# Patient Record
Sex: Female | Born: 2000 | Race: Black or African American | Hispanic: No | Marital: Single | Smoking: Never smoker
Health system: Southern US, Community
[De-identification: ages and names within clinical notes are randomized; demographics above are authoritative.]

## PROBLEM LIST (undated history)

## (undated) DIAGNOSIS — F329 Major depressive disorder, single episode, unspecified: Secondary | ICD-10-CM

## (undated) DIAGNOSIS — T7840XA Allergy, unspecified, initial encounter: Secondary | ICD-10-CM

## (undated) DIAGNOSIS — F909 Attention-deficit hyperactivity disorder, unspecified type: Secondary | ICD-10-CM

## (undated) DIAGNOSIS — D509 Iron deficiency anemia, unspecified: Secondary | ICD-10-CM

## (undated) DIAGNOSIS — F32A Depression, unspecified: Secondary | ICD-10-CM

## (undated) DIAGNOSIS — J45909 Unspecified asthma, uncomplicated: Secondary | ICD-10-CM

## (undated) HISTORY — DX: Depression, unspecified: F32.A

## (undated) HISTORY — DX: Allergy, unspecified, initial encounter: T78.40XA

## (undated) HISTORY — DX: Attention-deficit hyperactivity disorder, unspecified type: F90.9

## (undated) HISTORY — DX: Iron deficiency anemia, unspecified: D50.9

## (undated) HISTORY — DX: Unspecified asthma, uncomplicated: J45.909

## (undated) HISTORY — DX: Major depressive disorder, single episode, unspecified: F32.9

## (undated) HISTORY — PX: NO PAST SURGERIES: SHX2092

---
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
## 2021-01-15 ENCOUNTER — Other Ambulatory Visit: Payer: Self-pay | Admitting: Obstetrics and Gynecology

## 2021-01-21 ENCOUNTER — Encounter: Payer: Self-pay | Admitting: Obstetrics and Gynecology

## 2021-01-23 ENCOUNTER — Encounter: Payer: Self-pay | Admitting: Family Medicine

## 2021-01-30 ENCOUNTER — Encounter: Payer: Self-pay | Admitting: Obstetrics and Gynecology

## 2021-01-30 ENCOUNTER — Ambulatory Visit: Payer: 59 | Admitting: Obstetrics and Gynecology

## 2021-01-30 ENCOUNTER — Other Ambulatory Visit: Payer: Self-pay

## 2021-01-30 VITALS — BP 90/60 | Ht 61.0 in | Wt 130.0 lb

## 2021-01-30 DIAGNOSIS — Z3041 Encounter for surveillance of contraceptive pills: Secondary | ICD-10-CM

## 2021-01-30 DIAGNOSIS — R350 Frequency of micturition: Secondary | ICD-10-CM | POA: Diagnosis not present

## 2021-01-30 DIAGNOSIS — Z3046 Encounter for surveillance of implantable subdermal contraceptive: Secondary | ICD-10-CM

## 2021-01-30 LAB — POCT URINALYSIS DIPSTICK
Bilirubin, UA: NEGATIVE
Blood, UA: NEGATIVE
Glucose, UA: NEGATIVE
Ketones, UA: NEGATIVE
Leukocytes, UA: NEGATIVE
Nitrite, UA: NEGATIVE
Protein, UA: NEGATIVE
Spec Grav, UA: 1.025 (ref 1.010–1.025)
pH, UA: 6 (ref 5.0–8.0)

## 2021-01-30 MED ORDER — LEVONORGESTREL-ETHINYL ESTRAD 0.1-20 MG-MCG PO TABS: 1.0000 | ORAL_TABLET | Freq: Every day | ORAL | 3 refills | Status: DC

## 2021-01-30 NOTE — Progress Notes (Signed)
Kaitlyn Carrow, DO   Chief Complaint  Patient presents with   Contraception    Switch BC due to long cycles, mood swings. Not sure new BC method   STD testing   Urinary Tract Infection    Frequency urinating, no burning x 2-3 days    HPI:      Ms. Kaitlyn Webb is a 21 y.o. G0P0000 whose LMP was No LMP recorded. Patient has had an implant., presents today for nexplanon removal due to irreg bleeding, uncontrolled with POPs and now Lomedia 24. Having mood changes with lomedia. Would like to try OCPs only for cycle control. Nexplanon replaced 05/19/20. No hx of HTN, DVTs, migraines with aura.  She has never been sex active. Kaitlyn Webb you can get STDs from drinking after someone and her grandfather has an STD, pt doesn't know which one.  Pt with urinary frequency, with good flow, voiding every couple of hours. No caffeine use, no dysuria, urgency, hematuria, dysuria, LBP, fevers. Drinks at least 8 cups water daily. Same sx in past with neg UA, neg C&S.   Patient Active Problem List   Diagnosis Date Noted   Depression, major, single episode, moderate (HCC) 01/31/2016   Vitamin D deficiency 11/01/2015   Iron deficiency 10/11/2014   Asthma    ADHD (attention deficit hyperactivity disorder)    Allergy     Past Surgical History:  Procedure Laterality Date   NO PAST SURGERIES      Family History  Problem Relation Age of Onset   Hypertension Mother    Hypertension Father    Hypertension Maternal Grandmother    Arthritis Maternal Grandmother        Rheumatoid   Diabetes Maternal Grandmother    Lupus Maternal Grandmother    Cancer Maternal Grandmother        not sure cancer type   Diabetes Maternal Grandfather    Hypertension Maternal Grandfather     Social History   Socioeconomic History   Marital status: Single    Spouse name: Not on file   Number of children: Not on file   Years of education: Not on file   Highest education level: Not on file  Occupational History    Not on file  Tobacco Use   Smoking status: Never   Smokeless tobacco: Never  Vaping Use   Vaping Use: Never used  Substance and Sexual Activity   Alcohol use: No   Drug use: No   Sexual activity: Never    Birth control/protection: Implant, Pill  Other Topics Concern   Not on file  Social History Narrative   Not on file   Social Determinants of Health   Financial Resource Strain: Not on file  Food Insecurity: Not on file  Transportation Needs: Not on file  Physical Activity: Not on file  Stress: Not on file  Social Connections: Not on file  Intimate Partner Violence: Not on file    Outpatient Medications Prior to Visit  Medication Sig Dispense Refill   albuterol (VENTOLIN HFA) 108 (90 Base) MCG/ACT inhaler Inhale 1-2 puffs into the lungs every 6 (six) hours as needed for wheezing or shortness of breath. 1 Inhaler 6   EPINEPHRINE 0.3 mg/0.3 mL IJ SOAJ injection INJECT 0.3 MLS (0.3 MG TOTAL) INTO THE SKIN ONCE FOR 1 DOSE. 1 Device 12   ferrous sulfate (FERROUSUL) 325 (65 FE) MG tablet Take 1 tablet (325 mg total) by mouth 2 (two) times daily with a meal. 180 tablet 3  fluticasone (FLONASE) 50 MCG/ACT nasal spray Place 1 spray into both nostrils 2 (two) times daily.     Multiple Vitamin (MULTIVITAMIN) tablet Take 1 tablet by mouth daily.     naproxen (NAPROSYN) 500 MG tablet TAKE 1 TABLET (500 MG TOTAL) BY MOUTH 2 (TWO) TIMES DAILY WITH A MEAL. (Patient taking differently: Take 500 mg by mouth as needed.) 60 tablet 3   Vitamin D, Ergocalciferol, (DRISDOL) 1.25 MG (50000 UNIT) CAPS capsule Take 1 capsule (50,000 Units total) by mouth every 7 (seven) days. 12 capsule 0   Norethindrone Acetate-Ethinyl Estrad-FE (LOESTRIN 24 FE) 1-20 MG-MCG(24) tablet Take 1 tablet by mouth daily. CONTINUOUS DOSING 84 tablet 0   azelastine (ASTELIN) 0.1 % nasal spray PLACE 2 SPRAYS INTO BOTH NOSTRILS 2 (TWO) TIMES DAILY 30 mL 1   etonogestrel (NEXPLANON) 68 MG IMPL implant 1 each (68 mg total) by  Subdermal route once for 1 dose. 1 each 0   No facility-administered medications prior to visit.      ROS:  Review of Systems  Constitutional:  Negative for fever.  Gastrointestinal:  Negative for blood in stool, constipation, diarrhea, nausea and vomiting.  Genitourinary:  Positive for frequency and vaginal bleeding. Negative for dyspareunia, dysuria, flank pain, hematuria, urgency, vaginal discharge and vaginal pain.  Musculoskeletal:  Negative for back pain.  Skin:  Negative for rash.  BREAST: No symptoms   OBJECTIVE:   Vitals:  BP 90/60    Ht 5\' 1"  (1.549 m)    Wt 130 lb (59 kg)    BMI 24.56 kg/m   Physical Exam Constitutional:      Appearance: Normal appearance.  Pulmonary:     Effort: Pulmonary effort is normal.  Musculoskeletal:        General: Normal range of motion.  Neurological:     Mental Status: She is alert and oriented to person, place, and time.  Psychiatric:        Judgment: Judgment normal.    Nexplanon removal Procedure note - The Nexplanon was noted in the patient's arm and the end was identified. The skin was cleansed with a Betadine solution. A small injection of subcutaneous lidocaine with epinephrine was given over the end of the implant. An incision was made at the end of the implant. The rod was noted in the incision and grasped with a hemostat. It was noted to be intact.  Steri-Strip was placed approximating the incision. Hemostasis was noted.   Results: Results for orders placed or performed in visit on 01/30/21 (from the past 24 hour(s))  POCT Urinalysis Dipstick     Status: Normal   Collection Time: 01/30/21  3:55 PM  Result Value Ref Range   Color, UA yellow    Clarity, UA clear    Glucose, UA Negative Negative   Bilirubin, UA neg    Ketones, UA neg    Spec Grav, UA 1.025 1.010 - 1.025   Blood, UA neg    pH, UA 6.0 5.0 - 8.0   Protein, UA Negative Negative   Urobilinogen, UA     Nitrite, UA neg    Leukocytes, UA Negative Negative    Appearance     Odor       Assessment/Plan: Urinary frequency - Plan: POCT Urinalysis Dipstick; neg UA. Sx are normal voiding, reassurance. F/u prn.   Encounter for surveillance of contraceptive pills - Plan: levonorgestrel-ethinyl estradiol (AVIANE) 0.1-20 MG-MCG tablet; try different OCPs. Had mood changes with lomedia. Rx eRxd. Not sex active. F/u prn.  Nexplanon removal--Due to irreg bleeding, uncontrolled with addition of hormones. She was told to remove the dressing in 12-24 hours, to keep the incision area dry for 24 hours and to remove the Steristrip in 2-3  days.  Notify us if any signs of tenderness, redness, pain, or fevers develop.   Meds ordered this encounter  Medications   levonorgestrel-ethinyl estradiol (AVIANE) 0.1-20 MG-MCG tablet    Sig: Take 1 tablet by mouth daily.    Dispense:  84 tablet    Refill:  3    Order Specific Question:   Supervising Provider    Answer:   Gae Dry [id number]      Return if symptoms worsen or fail to improve.  Mathayus Stanbery B. Linzie Boursiquot, PA-C 01/30/2021 3:58 PM

## 2021-09-10 DIAGNOSIS — M2550 Pain in unspecified joint: Secondary | ICD-10-CM | POA: Diagnosis not present

## 2021-10-03 DIAGNOSIS — M2550 Pain in unspecified joint: Secondary | ICD-10-CM | POA: Diagnosis not present

## 2021-10-03 DIAGNOSIS — R69 Illness, unspecified: Secondary | ICD-10-CM | POA: Diagnosis not present

## 2021-10-03 DIAGNOSIS — J302 Other seasonal allergic rhinitis: Secondary | ICD-10-CM | POA: Diagnosis not present

## 2021-10-03 DIAGNOSIS — Z1331 Encounter for screening for depression: Secondary | ICD-10-CM | POA: Diagnosis not present

## 2021-10-03 DIAGNOSIS — Z1330 Encounter for screening examination for mental health and behavioral disorders, unspecified: Secondary | ICD-10-CM | POA: Diagnosis not present

## 2021-10-03 DIAGNOSIS — J4520 Mild intermittent asthma, uncomplicated: Secondary | ICD-10-CM | POA: Diagnosis not present

## 2021-10-03 DIAGNOSIS — R5383 Other fatigue: Secondary | ICD-10-CM | POA: Diagnosis not present

## 2021-10-03 DIAGNOSIS — T7805XA Anaphylactic reaction due to tree nuts and seeds, initial encounter: Secondary | ICD-10-CM | POA: Diagnosis not present

## 2021-10-31 ENCOUNTER — Encounter: Payer: Self-pay | Admitting: Obstetrics and Gynecology

## 2021-10-31 DIAGNOSIS — M2550 Pain in unspecified joint: Secondary | ICD-10-CM | POA: Diagnosis not present

## 2021-10-31 DIAGNOSIS — M791 Myalgia, unspecified site: Secondary | ICD-10-CM | POA: Diagnosis not present

## 2021-11-01 DIAGNOSIS — R69 Illness, unspecified: Secondary | ICD-10-CM | POA: Diagnosis not present

## 2021-11-07 ENCOUNTER — Encounter: Payer: Self-pay | Admitting: Obstetrics and Gynecology

## 2021-11-08 MED ORDER — MEDROXYPROGESTERONE ACETATE 150 MG/ML IM SUSP: 150.0000 mg | Freq: Once | INTRAMUSCULAR | Status: DC

## 2021-11-13 DIAGNOSIS — Z23 Encounter for immunization: Secondary | ICD-10-CM | POA: Diagnosis not present

## 2021-11-13 DIAGNOSIS — E611 Iron deficiency: Secondary | ICD-10-CM | POA: Diagnosis not present

## 2021-11-13 DIAGNOSIS — E559 Vitamin D deficiency, unspecified: Secondary | ICD-10-CM | POA: Diagnosis not present

## 2021-11-13 DIAGNOSIS — J4520 Mild intermittent asthma, uncomplicated: Secondary | ICD-10-CM | POA: Diagnosis not present

## 2021-11-13 DIAGNOSIS — R5383 Other fatigue: Secondary | ICD-10-CM | POA: Diagnosis not present

## 2021-11-21 ENCOUNTER — Other Ambulatory Visit (HOSPITAL_COMMUNITY)
Admission: RE | Admit: 2021-11-21 | Discharge: 2021-11-21 | Disposition: A | Discharge status: Home / Self Care | Payer: 59 | Source: Ambulatory Visit | Attending: Obstetrics | Admitting: Obstetrics

## 2021-11-21 ENCOUNTER — Encounter: Payer: Self-pay | Admitting: Obstetrics

## 2021-11-21 ENCOUNTER — Ambulatory Visit: Payer: 59 | Admitting: Obstetrics

## 2021-11-21 VITALS — BP 120/74 | Ht 61.0 in | Wt 137.0 lb

## 2021-11-21 DIAGNOSIS — N898 Other specified noninflammatory disorders of vagina: Secondary | ICD-10-CM | POA: Diagnosis not present

## 2021-11-21 DIAGNOSIS — R102 Pelvic and perineal pain: Secondary | ICD-10-CM | POA: Diagnosis not present

## 2021-11-21 NOTE — Progress Notes (Signed)
Obstetrics & Gynecology Office Visit   Chief Complaint:  Chief Complaint  Patient presents with   Pelvic Pain    Pt started having some cramping/pain after her first time having IC.     History of Present Illness: Kaitlyn Webb presents to discuss an episode of acute vaginal and rectal pain during and after intercourse. She carefully describes starting to have pain with having IC with her BF. She shares that she never has trouble with sexual toys and penetration, but recently her BF initiated sexual intercourse (consensual) and she felt acture pain both vaginally and then later rectally. She did not have an orgasm. She denies any vaginal bleeding, lacerations or other irritation from this experience. She did take some Ibuprofen afterwards and this helped. She mad an appointment to discuss what happened.   Review of Systems:  Review of Systems  Constitutional: Negative.   HENT: Negative.    Eyes: Negative.   Respiratory: Negative.    Gastrointestinal: Negative.   Genitourinary: Negative.   Musculoskeletal: Negative.   Skin: Negative.   All other systems reviewed and are negative.    Past Medical History:  Past Medical History:  Diagnosis Date   ADHD (attention deficit hyperactivity disorder)    Allergy    Asthma    Depression    Iron deficiency anemia     Past Surgical History:  Past Surgical History:  Procedure Laterality Date   NO PAST SURGERIES      Gynecologic History: Patient's last menstrual period was 10/23/2021 (exact date).  Obstetric History: G0P0000  Family History:  Family History  Problem Relation Age of Onset   Hypertension Mother    Hypertension Father    Hypertension Maternal Grandmother    Arthritis Maternal Grandmother        Rheumatoid   Diabetes Maternal Grandmother    Lupus Maternal Grandmother    Cancer Maternal Grandmother        not sure cancer type   Diabetes Maternal Grandfather    Hypertension Maternal Grandfather     Social  History:  Social History   Socioeconomic History   Marital status: Single    Spouse name: Not on file   Number of children: Not on file   Years of education: Not on file   Highest education level: Not on file  Occupational History   Not on file  Tobacco Use   Smoking status: Never   Smokeless tobacco: Never  Vaping Use   Vaping Use: Never used  Substance and Sexual Activity   Alcohol use: No   Drug use: No   Sexual activity: Yes    Birth control/protection: Pill  Other Topics Concern   Not on file  Social History Narrative   Not on file   Social Determinants of Health   Financial Resource Strain: Not on file  Food Insecurity: Not on file  Transportation Needs: Not on file  Physical Activity: Insufficiently Active (01/31/2017)   Exercise Vital Sign    Days of Exercise per Week: 2 days    Minutes of Exercise per Session: 60 min  Stress: No Stress Concern Present (01/31/2017)   [employer] of Occupational Health - Occupational Stress Questionnaire    Feeling of Stress : Not at all  Social Connections: Not on file  Intimate Partner Violence: Not on file    Allergies:  Allergies  Allergen Reactions   Other Other (See Comments)    NUTS CATS DOGS POLLEN GRASSES    Medications: Prior to Admission  medications   Medication Sig Start Date End Date Taking? Authorizing Provider  albuterol (VENTOLIN HFA) 108 (90 Base) MCG/ACT inhaler Inhale 1-2 puffs into the lungs every 6 (six) hours as needed for wheezing or shortness of breath. 06/03/18  Yes Johnson, Megan P, DO  EPINEPHRINE 0.3 mg/0.3 mL IJ SOAJ injection INJECT 0.3 MLS (0.3 MG TOTAL) INTO THE SKIN ONCE FOR 1 DOSE. 04/11/18  Yes Johnson, Megan P, DO  ferrous sulfate (FERROUSUL) 325 (65 FE) MG tablet Take 1 tablet (325 mg total) by mouth 2 (two) times daily with a meal. 05/09/18  Yes Johnson, Megan P, DO  fluticasone (FLONASE) 50 MCG/ACT nasal spray Place 1 spray into both nostrils 2 (two) times daily. 12/20/20  Yes  [provider]  levonorgestrel-ethinyl estradiol (AVIANE) 0.1-20 MG-MCG tablet Take 1 tablet by mouth daily. 01/30/21  Yes Copland, Ilona Sorrel, PA-C  Multiple Vitamin (MULTIVITAMIN) tablet Take 1 tablet by mouth daily.   Yes [provider]  naproxen (NAPROSYN) 500 MG tablet TAKE 1 TABLET (500 MG TOTAL) BY MOUTH 2 (TWO) TIMES DAILY WITH A MEAL. Patient taking differently: Take 500 mg by mouth as needed. 09/03/18  Yes Johnson, Megan P, DO  Vitamin D, Ergocalciferol, (DRISDOL) 1.25 MG (50000 UNIT) CAPS capsule Take 1 capsule (50,000 Units total) by mouth every 7 (seven) days. 10/07/20  Yes [name], DO    Physical Exam Vitals:  Vitals:   11/21/21 1405  BP: 120/74   Patient's last menstrual period was 10/23/2021 (exact date).  Physical Exam Constitutional:      Appearance: Normal appearance. She is normal weight.  Cardiovascular:     Rate and Rhythm: Normal rate and regular rhythm.  Pulmonary:     Effort: Pulmonary effort is normal.     Breath sounds: Normal breath sounds.  Abdominal:     General: Abdomen is flat.     Palpations: Abdomen is soft.  Genitourinary:    General: Normal vulva.     Rectum: Normal.     Comments: Normal female genitalia. No lacerations, lesions or areas of irritation noted. Normal vaginal rugae. Some white vaginal discharge noted- thin, nonmalodorous Aptima swab retrieved for testing. Musculoskeletal:     Cervical back: Normal range of motion and neck supple.  Skin:    General: Skin is warm and dry.  Neurological:     General: No focal deficit present.     Mental Status: She is alert and oriented to person, place, and time.  Psychiatric:        Mood and Affect: Mood normal.        Behavior: Behavior normal.      Assessment: 21 y.o. G0P0000 with episode fo peovic and rectal pain during intercourse. Vaginal discharge  Plan: Problem List Items Addressed This Visit   None Visit Diagnoses     Vaginal discharge    -  Primary    Relevant Orders   Cervicovaginal ancillary only   Complaint of pelvic pain         We discussed the possibility of inadequate lubrication , which she feels is not the issue. Aptima swab sent to check for any infection. I have advised her to reach out if this occurs again. She is to use Ibuprofen and also try a tub soak in warm to hot water. Much reassurance provded today. She is alerted that at age 21 she needs to have a pap smear. Will follow her based on the aptima results.Mirna Mires, CNM  11/21/2021 7:37 PM

## 2021-11-23 LAB — CERVICOVAGINAL ANCILLARY ONLY
Bacterial Vaginitis (gardnerella): POSITIVE — AB
Candida Glabrata: NEGATIVE
Candida Vaginitis: NEGATIVE
Chlamydia: NEGATIVE
Comment: NEGATIVE
Comment: NEGATIVE
Comment: NEGATIVE
Comment: NEGATIVE
Comment: NEGATIVE
Comment: NORMAL
Neisseria Gonorrhea: NEGATIVE
Trichomonas: NEGATIVE

## 2021-11-24 ENCOUNTER — Encounter: Payer: Self-pay | Admitting: Obstetrics

## 2021-11-24 ENCOUNTER — Other Ambulatory Visit: Payer: Self-pay | Admitting: Obstetrics

## 2021-11-24 DIAGNOSIS — B9689 Other specified bacterial agents as the cause of diseases classified elsewhere: Secondary | ICD-10-CM

## 2021-11-24 MED ORDER — METRONIDAZOLE 500 MG PO TABS: 500.0000 mg | ORAL_TABLET | Freq: Two times a day (BID) | ORAL | 0 refills | Status: DC

## 2021-11-24 NOTE — Progress Notes (Signed)
Pateint recently seen in the office. Aptima swab shows BV. A Rx for Metronidazole is sent to her pharmacy.  Mirna Mires, CNM  11/24/2021 1:54 AM

## 2021-11-30 ENCOUNTER — Encounter: Payer: Self-pay | Admitting: Obstetrics

## 2021-11-30 DIAGNOSIS — B9689 Other specified bacterial agents as the cause of diseases classified elsewhere: Secondary | ICD-10-CM

## 2021-12-01 MED ORDER — METRONIDAZOLE 500 MG PO TABS: 500.0000 mg | ORAL_TABLET | Freq: Two times a day (BID) | ORAL | 0 refills | Status: AC

## 2021-12-05 ENCOUNTER — Ambulatory Visit (INDEPENDENT_AMBULATORY_CARE_PROVIDER_SITE_OTHER): Payer: 59

## 2021-12-05 VITALS — Wt 133.7 lb

## 2021-12-05 DIAGNOSIS — Z3042 Encounter for surveillance of injectable contraceptive: Secondary | ICD-10-CM | POA: Diagnosis not present

## 2021-12-05 DIAGNOSIS — Z3202 Encounter for pregnancy test, result negative: Secondary | ICD-10-CM

## 2021-12-05 LAB — POCT URINE PREGNANCY: Preg Test, Ur: NEGATIVE

## 2021-12-05 MED ORDER — MEDROXYPROGESTERONE ACETATE 150 MG/ML IM SUSP
150.0000 mg | Freq: Once | INTRAMUSCULAR | Status: AC
  Administered 2021-12-05: 150 mg via INTRAMUSCULAR

## 2021-12-05 NOTE — Patient Instructions (Signed)

## 2021-12-05 NOTE — Progress Notes (Addendum)
Date last pap: N/A. Last Depo-Provera: N/A, receiving first injection today. Side Effects if any: N/A. Serum HCG indicated? No. Depo-Provera 150 mg IM given by: Maye Hides, NCMA. Next appointment due 2/20/-03/07/2022.

## 2022-02-08 ENCOUNTER — Ambulatory Visit (INDEPENDENT_AMBULATORY_CARE_PROVIDER_SITE_OTHER): Payer: 59 | Admitting: Obstetrics

## 2022-02-08 ENCOUNTER — Encounter: Payer: Self-pay | Admitting: Obstetrics

## 2022-02-08 VITALS — BP 117/70 | HR 87 | Ht 61.0 in | Wt 128.0 lb

## 2022-02-08 DIAGNOSIS — Z3042 Encounter for surveillance of injectable contraceptive: Secondary | ICD-10-CM

## 2022-02-08 DIAGNOSIS — Z3202 Encounter for pregnancy test, result negative: Secondary | ICD-10-CM | POA: Diagnosis not present

## 2022-02-08 DIAGNOSIS — N939 Abnormal uterine and vaginal bleeding, unspecified: Secondary | ICD-10-CM

## 2022-02-08 LAB — POCT URINE PREGNANCY: Preg Test, Ur: NEGATIVE

## 2022-02-08 NOTE — Progress Notes (Signed)
Obstetrics & Gynecology Office Visit   Chief Complaint:  Chief Complaint  Patient presents with   Pelvic Pain    Center of pelvic    History of Present Illness: Yvonna pesents today with a complaint of irregular vaginal spotting that has bothered her for several weeks now. She was last seen for a contraceptive change, moving from OCPs to Depo Provera.  She  reports no new medical problems. She is not wearing a pad today.   Review of Systems:  Review of Systems  Constitutional: Negative.   HENT: Negative.    Eyes: Negative.   Respiratory: Negative.    Cardiovascular: Negative.   Gastrointestinal: Negative.   Genitourinary: Negative.   Skin: Negative.   All other systems reviewed and are negative.    Past Medical History:  Past Medical History:  Diagnosis Date   ADHD (attention deficit hyperactivity disorder)    Allergy    Asthma    Depression    Iron deficiency anemia     Past Surgical History:  Past Surgical History:  Procedure Laterality Date   NO PAST SURGERIES      Gynecologic History: No LMP recorded. Patient has had an injection.  Obstetric History: G0P0000  Family History:  Family History  Problem Relation Age of Onset   Hypertension Mother    Hypertension Father    Hypertension Maternal Grandmother    Arthritis Maternal Grandmother        Rheumatoid   Diabetes Maternal Grandmother    Lupus Maternal Grandmother    Cancer Maternal Grandmother        not sure cancer type   Diabetes Maternal Grandfather    Hypertension Maternal Grandfather     Social History:  Social History   Socioeconomic History   Marital status: Single    Spouse name: Not on file   Number of children: Not on file   Years of education: Not on file   Highest education level: Not on file  Occupational History   Not on file  Tobacco Use   Smoking status: Never   Smokeless tobacco: Never  Vaping Use   Vaping Use: Never used  Substance and Sexual Activity   Alcohol  use: No   Drug use: No   Sexual activity: Not Currently    Birth control/protection: Injection  Other Topics Concern   Not on file  Social History Narrative   Not on file   Social Determinants of Health   Financial Resource Strain: Not on file  Food Insecurity: Not on file  Transportation Needs: Not on file  Physical Activity: Insufficiently Active (01/31/2017)   Exercise Vital Sign    Days of Exercise per Week: 2 days    Minutes of Exercise per Session: 60 min  Stress: No Stress Concern Present (01/31/2017)   Gas    Feeling of Stress : Not at all  Social Connections: Not on file  Intimate Partner Violence: Not on file    Allergies:  Allergies  Allergen Reactions   Other Other (See Comments)    NUTS  CATS  DOGS  POLLEN  GRASSES  NUTS   CATS  DOGS  POLLEN  GRASSES    Medications: Prior to Admission medications   Medication Sig Start Date End Date Taking? Authorizing Provider  albuterol (VENTOLIN HFA) 108 (90 Base) MCG/ACT inhaler Inhale 1-2 puffs into the lungs every 6 (six) hours as needed for wheezing or shortness of breath. 06/03/18  Yes Johnson, Megan P, DO  EPINEPHRINE 0.3 mg/0.3 mL IJ SOAJ injection INJECT 0.3 MLS (0.3 MG TOTAL) INTO THE SKIN ONCE FOR 1 DOSE. 04/11/18  Yes Johnson, Megan P, DO  ferrous sulfate (FERROUSUL) 325 (65 FE) MG tablet Take 1 tablet (325 mg total) by mouth 2 (two) times daily with a meal. 05/09/18  Yes Johnson, Megan P, DO  fluticasone (FLONASE) 50 MCG/ACT nasal spray Place 1 spray into both nostrils 2 (two) times daily. 12/20/20  Yes [provider]  Multiple Vitamin (MULTIVITAMIN) tablet Take 1 tablet by mouth daily.   Yes [provider]  naproxen (NAPROSYN) 500 MG tablet TAKE 1 TABLET (500 MG TOTAL) BY MOUTH 2 (TWO) TIMES DAILY WITH A MEAL. Patient taking differently: Take 500 mg by mouth as needed. 09/03/18  Yes Johnson, Megan P, DO  QVAR  REDIHALER 40 MCG/ACT inhaler Inhale into the lungs.   Yes [provider]    Physical Exam Vitals:  Vitals:   02/08/22 0956  BP: 117/70  Pulse: 87   No LMP recorded. Patient has had an injection.  Physical Exam Vitals and nursing note reviewed.  Constitutional:      Appearance: Normal appearance. She is normal weight.  Cardiovascular:     Rate and Rhythm: Normal rate and regular rhythm.  Pulmonary:     Effort: Pulmonary effort is normal.     Breath sounds: Normal breath sounds.  Abdominal:     Palpations: Abdomen is soft.  Genitourinary:    General: Normal vulva.     Rectum: Normal.     Comments: Spec exam reveals no vaginal discharge or visible bleeding. No exteranl lesions or areas of irritation. Normal appearing vaginal mucosa. Musculoskeletal:        General: Normal range of motion.     Cervical back: Normal range of motion and neck supple.  Skin:    General: Skin is warm and dry.  Neurological:     Mental Status: She is alert.  Psychiatric:        Mood and Affect: Mood normal.        Behavior: Behavior normal.      Assessment: 22 y.o. G0P0000 No problem-specific Assessment & Plan notes found for this encounter.   Plan: Problem List Items Addressed This Visit   None Visit Diagnoses     Vaginal spotting    -  Primary   Relevant Orders   POCT urine pregnancy (Completed)     Time spent providing education on Depo and the usual side effects, including irregular vaginal spotting that can last from weeks to months. She has had only one injection. Encouraged her to continue with her next injection and she can follow up at the office for another consultation on contraception if she desires. She verbalizes understanding. Also suggested she try using several repeated doses of Ibuprofen when the spotting occurs.  Imagene Riches, CNM  02/09/2022 5:01 PM

## 2022-02-09 DIAGNOSIS — Z309 Encounter for contraceptive management, unspecified: Secondary | ICD-10-CM | POA: Insufficient documentation

## 2022-02-20 ENCOUNTER — Ambulatory Visit (INDEPENDENT_AMBULATORY_CARE_PROVIDER_SITE_OTHER): Payer: BC Managed Care – PPO

## 2022-02-20 VITALS — BP 100/70 | Ht 61.0 in | Wt 129.0 lb

## 2022-02-20 DIAGNOSIS — Z3042 Encounter for surveillance of injectable contraceptive: Secondary | ICD-10-CM

## 2022-02-20 MED ORDER — MEDROXYPROGESTERONE ACETATE 150 MG/ML IM SUSP
150.0000 mg | Freq: Once | INTRAMUSCULAR | Status: AC
  Administered 2022-02-20: 150 mg via INTRAMUSCULAR

## 2022-02-20 NOTE — Patient Instructions (Signed)

## 2022-02-20 NOTE — Progress Notes (Signed)
    NURSE VISIT NOTE  Subjective:    Patient ID: Kaitlyn Webb, female    DOB: 12-Apr-2000, 22 y.o.   MRN: [id number]  HPI  Patient is a 22 y.o. G8P0000 female who presents for depo provera injection.   Objective:    BP 100/70   Ht 5' 1"$  (1.549 m)   Wt 129 lb (58.5 kg)   BMI 24.37 kg/m   Last Annual: Patient has not had an annual visit, will call soon to schedule with Gigi Gin since schedule is not out for scheduling as of today. Last pap: N/A. Last Depo-Provera: 12/05/2021. Side Effects if any: none. Serum HCG indicated? No . Depo-Provera 150 mg IM given by: Drenda Freeze, CMA. Site: Right Deltoid  Lab Review    Assessment:   1. Encounter for surveillance of injectable contraceptive      Plan:   Next appointment due between May 8 and May 22.    Drenda Freeze, CMA

## 2022-05-15 ENCOUNTER — Ambulatory Visit: Payer: Self-pay

## 2022-05-18 ENCOUNTER — Ambulatory Visit (INDEPENDENT_AMBULATORY_CARE_PROVIDER_SITE_OTHER): Payer: Managed Care, Other (non HMO)

## 2022-05-18 VITALS — BP 124/76 | Ht 61.0 in | Wt 134.0 lb

## 2022-05-18 DIAGNOSIS — Z3042 Encounter for surveillance of injectable contraceptive: Secondary | ICD-10-CM

## 2022-05-18 MED ORDER — MEDROXYPROGESTERONE ACETATE 150 MG/ML IM SUSP
150.0000 mg | Freq: Once | INTRAMUSCULAR | Status: AC
  Administered 2022-05-18: 150 mg via INTRAMUSCULAR

## 2022-05-18 NOTE — Progress Notes (Signed)
    NURSE VISIT NOTE  Subjective:    Patient ID: Kaitlyn Webb, female    DOB: 08/09/2000, 22 y.o.   MRN: [id number]  HPI  Patient is a 22 y.o. G0P0000 female who presents for depo provera injection.   Objective:    BP 124/76   Ht 5\' 1"  (1.549 m)   Wt 134 lb (60.8 kg)   BMI 25.32 kg/m   Last Annual: n/a sent to schedule . Last pap: N/A. Last Depo-Provera: 02/20/22. Side Effects if any: spotting. Serum HCG indicated? No . Depo-Provera 150 mg IM given by: Cornelius Moras, CMA. Site: Left Deltoid    Assessment:   1. Encounter for surveillance of injectable contraceptive      Plan:   Next appointment due between 08/03/22 and 08/17/22.    Cornelius Moras, CMA

## 2022-05-18 NOTE — Patient Instructions (Signed)
Contraceptive Injection A contraceptive injection is a shot that prevents pregnancy. It is also called a birth control shot. The shot contains the hormone progestin, which prevents pregnancy by: Stopping the ovaries from releasing eggs. Thickening cervical mucus to prevent sperm from entering the cervix. Thinning the lining of the uterus to prevent a fertilized egg from attaching to the uterus. Contraceptive injections are given under the skin (subcutaneous) or into a muscle (intramuscular). For these shots to work, you must get one of them every 3 months (12-13 weeks) from a health care provider. Tell a health care provider about: Any allergies you have. All medicines you are taking, including vitamins, herbs, eye drops, creams, and over-the-counter medicines. Any blood disorders you have. Any medical conditions you have. Whether you are pregnant or may be pregnant. What are the risks? Generally, this is a safe procedure. However, problems may occur, including: Mood changes or depression. Loss of bone density (osteoporosis) after long-term use. Blood clots. This is rare. Higher risk of an egg being fertilized outside your uterus (ectopic pregnancy).This is rare. What happens before the procedure? Your health care provider may do a routine physical exam. You may have a test to make sure you are not pregnant. What happens during the procedure?  The area where the shot will be given will be cleaned and sanitized with alcohol. A needle will be inserted into a muscle in your upper arm or buttock, or into the skin of your thigh or abdomen. The needle will be attached to a syringe with the medicine inside of it. The medicine will be pushed through the syringe and injected into your body. A small bandage (dressing) may be placed over the injection site. What can I expect after the procedure? After the procedure, it is common to have: Soreness around the injection site for a couple of  days. Irregular menstrual bleeding. Weight gain. Breast tenderness. Headaches. Discomfort in your abdomen. Ask your health care provider whether you need to use an added method of birth control (backup contraception), such as a condom, sponge, or spermicide. If the first shot is given 1-7 days after the start of your last menstrual period, you will not need backup contraception. If the first shot is given at any other time during your menstrual cycle, you should avoid having sex. If you do have sex, you will need to use backup contraception for 7 days after you receive the shot. Follow these instructions at home: General instructions Take over-the-counter and prescription medicines only as told by your health care provider. Do not rub or massage the injection site. Track your menstrual periods so you will know if they become irregular. Always use a condom to protect against sexually transmitted infections (STIs). Make sure you schedule an appointment in time for your next shot and mark it on your calendar. You must get an injection every 3 months (12-13 weeks) to prevent pregnancy. Lifestyle Do not use any products that contain nicotine or tobacco. These products include cigarettes, chewing tobacco, and vaping devices, such as e-cigarettes. If you need help quitting, ask your health care provider. Eat foods that are high in calcium and vitamin D, such as milk, cheese, and salmon. Doing this may help with any loss in bone density caused by the contraceptive injection. Ask your health care provider for dietary recommendations. Contact a health care provider if you: Have nausea or vomiting. Have abnormal vaginal discharge or bleeding. Miss a menstrual period or think you might be pregnant. Experience mood changes   or depression. Feel dizzy or light-headed. Have leg pain. Get help right away if you: Have chest pain or cough up blood. Have shortness of breath. Have a severe headache that does  not go away. Have numbness in any part of your body. Have slurred speech or vision problems. Have vaginal bleeding that is abnormally heavy or does not stop, or you have severe pain in your abdomen. Have depression that does not get better with treatment. If you ever feel like you may hurt yourself or others, or have thoughts about taking your own life, get help right away. Go to your nearest emergency department or: Call your local emergency services (911 in the [place] Call a suicide crisis helpline, such as the National Suicide Prevention Lifeline at 1-800-273-8255 or 988 in the [place] This is open 24 hours a day in the [place] Text the Crisis Text Line at 741741 (in the [place] Summary A contraceptive injection is a shot that prevents pregnancy. It is also called the birth control shot. The shot is given under the skin (subcutaneous) or into a muscle (intramuscular). After this procedure, it is common to have soreness around the injection site for a couple of days. To prevent pregnancy, the shot must be given by a health care provider every 3 months (12-13 weeks). After you have the shot, ask your health care provider whether you need to use an added method of birth control (backup contraception), such as a condom, sponge, or spermicide. This information is not intended to replace advice given to you by your health care provider. Make sure you discuss any questions you have with your health care provider. Document Revised: 07/13/2020 Document Reviewed: 06/29/2019 Elsevier Patient Education  2023 Elsevier Inc.  

## 2022-06-20 ENCOUNTER — Ambulatory Visit: Payer: Managed Care, Other (non HMO) | Admitting: Obstetrics

## 2022-07-09 ENCOUNTER — Encounter: Payer: Self-pay | Admitting: Obstetrics

## 2022-07-09 ENCOUNTER — Ambulatory Visit: Payer: Managed Care, Other (non HMO) | Admitting: Obstetrics

## 2022-07-09 ENCOUNTER — Other Ambulatory Visit (HOSPITAL_COMMUNITY)
Admission: RE | Admit: 2022-07-09 | Discharge: 2022-07-09 | Disposition: A | Discharge status: Home / Self Care | Payer: Managed Care, Other (non HMO) | Source: Ambulatory Visit | Attending: Obstetrics | Admitting: Obstetrics

## 2022-07-09 VITALS — BP 131/77 | HR 95 | Ht 61.0 in | Wt 137.4 lb

## 2022-07-09 DIAGNOSIS — N898 Other specified noninflammatory disorders of vagina: Secondary | ICD-10-CM | POA: Insufficient documentation

## 2022-07-09 DIAGNOSIS — Z3041 Encounter for surveillance of contraceptive pills: Secondary | ICD-10-CM

## 2022-07-09 DIAGNOSIS — Z124 Encounter for screening for malignant neoplasm of cervix: Secondary | ICD-10-CM

## 2022-07-09 DIAGNOSIS — Z01419 Encounter for gynecological examination (general) (routine) without abnormal findings: Secondary | ICD-10-CM

## 2022-07-09 MED ORDER — MEDROXYPROGESTERONE ACETATE 150 MG/ML IM SUSP: 150.0000 mg | INTRAMUSCULAR | Status: AC

## 2022-07-09 NOTE — Progress Notes (Signed)
Gynecology Annual Exam  PCP: [name], DO  Chief Complaint:  Chief Complaint  Patient presents with   Annual Exam    History of Present Illness:  Ms. Kaitlyn Webb is a 22 y.o. G0P0000 who LMP was No LMP recorded. Patient has had an injection., presents today for her annual examination.  Her menses are absent due to her use of Depo provera,  Dysmenorrhea none. She does not have intermenstrual bleeding.    Kaitlyn Webb lives with her mother. She helps taking care of family and friends dogs. 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 She started on Depo Provera months back and loves this method.  She is not sexually active.  Last Pap: she has never had a pap. Now 21 yo / Hx of STDs: none  There is no FH of breast cancer. There is no FH of ovarian cancer. The patient does do self-breast exams.  Tobacco use: The patient denies current or previous tobacco use. Alcohol use: none Exercise: moderately active    The patient wears seatbelts: yes.   The patient reports that domestic violence in her life is absent.   Past Medical History:  Diagnosis Date   ADHD (attention deficit hyperactivity disorder)    Allergy    Asthma    Depression    Iron deficiency anemia     Past Surgical History:  Procedure Laterality Date   NO PAST SURGERIES      Prior to Admission medications   Medication Sig Start Date End Date Taking? Authorizing Provider  albuterol (VENTOLIN HFA) 108 (90 Base) MCG/ACT inhaler Inhale 1-2 puffs into the lungs every 6 (six) hours as needed for wheezing or shortness of breath. 06/03/18  Yes Johnson, Megan P, DO  BREO ELLIPTA 100-25 MCG/ACT AEPB 1 puff daily. 05/08/22  Yes [provider]  EPINEPHRINE 0.3 mg/0.3 mL IJ SOAJ injection INJECT 0.3 MLS (0.3 MG TOTAL) INTO THE SKIN ONCE FOR 1 DOSE. 04/11/18  Yes Johnson, Megan P, DO  ferrous sulfate (FERROUSUL) 325 (65 FE) MG tablet Take 1 tablet (325 mg total) by mouth 2 (two) times daily with a meal.  05/09/18  Yes Johnson, Megan P, DO  fluticasone (FLONASE) 50 MCG/ACT nasal spray Place 1 spray into both nostrils 2 (two) times daily. 12/20/20  Yes [provider]  montelukast (SINGULAIR) 5 MG chewable tablet Chew 5 mg by mouth at bedtime. 02/09/22  Yes [provider]  Multiple Vitamin (MULTIVITAMIN) tablet Take 1 tablet by mouth daily.   Yes [provider]  naproxen (NAPROSYN) 500 MG tablet TAKE 1 TABLET (500 MG TOTAL) BY MOUTH 2 (TWO) TIMES DAILY WITH A MEAL. Patient taking differently: Take 500 mg by mouth as needed. 09/03/18  Yes Johnson, Megan P, DO  QVAR REDIHALER 40 MCG/ACT inhaler Inhale into the lungs.   Yes [provider]  Vitamin D, Ergocalciferol, (DRISDOL) 1.25 MG (50000 UNIT) CAPS capsule Take 50,000 Units by mouth once a week. 03/07/22  Yes [provider]    Allergies  Allergen Reactions   Other Other (See Comments)    NUTS  CATS  DOGS  POLLEN  GRASSES  NUTS   CATS  DOGS  POLLEN  GRASSES    Gynecologic History: No LMP recorded. Patient has had an injection. History of abnormal pap smear: No History of STI: No   Obstetric History: G0P0000  Social History   Socioeconomic History   Marital status: Single    Spouse name: Not on file  Number of children: Not on file   Years of education: Not on file   Highest education level: Not on file  Occupational History   Not on file  Tobacco Use   Smoking status: Never   Smokeless tobacco: Never  Vaping Use   Vaping Use: Never used  Substance and Sexual Activity   Alcohol use: No   Drug use: No   Sexual activity: Not Currently    Birth control/protection: Injection  Other Topics Concern   Not on file  Social History Narrative   Not on file   Social Determinants of Health   Financial Resource Strain: Not on file  Food Insecurity: Not on file  Transportation Needs: Not on file  Physical Activity: Insufficiently Active (01/31/2017)   Exercise Vital Sign     Days of Exercise per Week: 2 days    Minutes of Exercise per Session: 60 min  Stress: No Stress Concern Present (01/31/2017)   [employer] of Occupational Health - Occupational Stress Questionnaire    Feeling of Stress : Not at all  Social Connections: Not on file  Intimate Partner Violence: Not on file    Family History  Problem Relation Age of Onset   Hypertension Mother    Hypertension Father    Hypertension Maternal Grandmother    Arthritis Maternal Grandmother        Rheumatoid   Diabetes Maternal Grandmother    Lupus Maternal Grandmother    Cancer Maternal Grandmother        not sure cancer type   Diabetes Maternal Grandfather    Hypertension Maternal Grandfather     ROS   Physical Exam BP 131/77   Pulse 95   Ht 5\' 1"  (1.549 m)   Wt 137 lb 6.4 oz (62.3 kg)   BMI 25.96 kg/m    OBGyn Exam  Female chaperone present for pelvic and breast  portions of the physical exam  Results:     Assessment: 22 y.o. G0P0000 female here for routine annual gynecologic examination First pap smear Hx of BV- aptima swab sent to test for yeast /BV  Plan: Problem List Items Addressed This Visit   None Visit Diagnoses     Vaginal discharge    -  Primary   Relevant Orders   Cervicovaginal ancillary only   Women's annual routine gynecological examination       Relevant Orders   Cytology - PAP   Cervicovaginal ancillary only   Cervical cancer screening       Relevant Orders   Cytology - PAP       Screening: -- Blood pressure screen normal -- Weight screening: normal -- Depression screening negative (PHQ-9) -- Nutrition: normal -- cholesterol screening: not due for screening -- osteoporosis screening: not due -- tobacco screening: not using -- alcohol screening: AUDIT questionnaire indicates low-risk usage. -- family history of breast cancer screening: done. not at high risk. -- no evidence of domestic violence or intimate partner violence. -- STD screening:  gonorrhea/chlamydia NAAT  aptima swab collected for yeast/BV only. Not sexually active -- pap smear collected per ASCCP guidelines -- flu vaccine  per her PCP -- HPV vaccination series:  uncertain re receiving.  Mirna Mires, CNM  07/09/2022 10:22 AM   07/09/2022 10:19 AM

## 2022-07-10 LAB — CERVICOVAGINAL ANCILLARY ONLY
Bacterial Vaginitis (gardnerella): NEGATIVE
Candida Glabrata: NEGATIVE
Candida Vaginitis: NEGATIVE
Comment: NEGATIVE
Comment: NEGATIVE
Comment: NEGATIVE

## 2022-07-12 LAB — CYTOLOGY - PAP
Comment: NEGATIVE
Diagnosis: UNDETERMINED — AB
High risk HPV: NEGATIVE

## 2022-07-16 ENCOUNTER — Encounter: Payer: Self-pay | Admitting: Obstetrics

## 2022-07-16 DIAGNOSIS — R87610 Atypical squamous cells of undetermined significance on cytologic smear of cervix (ASC-US): Secondary | ICD-10-CM | POA: Insufficient documentation

## 2022-08-10 ENCOUNTER — Ambulatory Visit (INDEPENDENT_AMBULATORY_CARE_PROVIDER_SITE_OTHER): Payer: Managed Care, Other (non HMO)

## 2022-08-10 VITALS — Wt 137.7 lb

## 2022-08-10 DIAGNOSIS — Z3042 Encounter for surveillance of injectable contraceptive: Secondary | ICD-10-CM | POA: Diagnosis not present

## 2022-08-10 MED ORDER — MEDROXYPROGESTERONE ACETATE 150 MG/ML IM SUSY
150.0000 mg | PREFILLED_SYRINGE | Freq: Once | INTRAMUSCULAR | Status: AC
  Administered 2022-08-10: 150 mg via INTRAMUSCULAR

## 2022-08-10 NOTE — Progress Notes (Signed)
    NURSE VISIT NOTE  Subjective:    Patient ID: Garima Stodghill, female    DOB: November 26, 2000, 22 y.o.   MRN: [id number]  HPI  Patient is a 22 y.o. G0P0000 female who presents for depo provera injection.   Objective:    Wt 137 lb 11.2 oz (62.5 kg)   BMI 26.02 kg/m   Last Annual: 07/09/2022. Last pap: 07/09/2022. Last Depo-Provera: 05/18/2022. Side Effects if any: none. Serum HCG indicated? No . Depo-Provera 150 mg IM given by: Doristine Devoid, CMA Site: Right Deltoid  Lab Review  @THIS  VISIT ONLY@  Assessment:   1. Encounter for surveillance of injectable contraceptive      Plan:   Next appointment due between October 25 and November 8.    Burtis Junes, CMA

## 2022-11-02 ENCOUNTER — Ambulatory Visit (INDEPENDENT_AMBULATORY_CARE_PROVIDER_SITE_OTHER): Payer: Managed Care, Other (non HMO)

## 2022-11-02 VITALS — BP 124/72 | HR 77 | Resp 16 | Ht 61.0 in | Wt 141.2 lb

## 2022-11-02 DIAGNOSIS — Z3042 Encounter for surveillance of injectable contraceptive: Secondary | ICD-10-CM

## 2022-11-02 MED ORDER — MEDROXYPROGESTERONE ACETATE 150 MG/ML IM SUSP
150.0000 mg | Freq: Once | INTRAMUSCULAR | Status: AC
  Administered 2022-11-02: 150 mg via INTRAMUSCULAR

## 2022-11-02 NOTE — Patient Instructions (Signed)

## 2022-11-02 NOTE — Progress Notes (Signed)
    NURSE VISIT NOTE  Subjective:    Patient ID: Mckinnley Cottier, female    DOB: April 29, 2000, 22 y.o.   MRN: [id number]  HPI  Patient is a 22 y.o. G0P0000 female who presents for depo provera injection.   Objective:    BP 124/72   Pulse 77   Resp 16   Ht 5\' 1"  (1.549 m)   Wt 141 lb 3.2 oz (64 kg)   BMI 26.68 kg/m   Last Annual: 07/09/2022. Last pap: 07/09/2022. Last Depo-Provera: 08/10/2022. Side Effects if any: none. Serum HCG indicated? No . Depo-Provera 150 mg IM given by: Santiago Bumpers, CMA. Site: Left Deltoid  Lab Review  None  Assessment:   1. Encounter for surveillance of injectable contraceptive      Plan:   Next appointment due between Jan. 17 and Jan.31.    Santiago Bumpers, CMA [facility]

## 2022-11-12 ENCOUNTER — Encounter: Payer: Self-pay | Admitting: Obstetrics

## 2022-11-23 NOTE — Telephone Encounter (Signed)
Spoke to patient who shares her concern that with the next administration will make it difficult for her to continue on Depo Provera for contraception. We discussed  the option of IUD placement using Motrin beforehand, a dose of Xanax and cytotec prior to her appointment. She was reassured that Depo would continue to be available to her, and ultimately decided in our conversation that she would sty on the Depo.  Next due for a dose in [place] or February.  Mirna Mires, CNM  11/23/2022 3:49 PM

## 2022-12-31 ENCOUNTER — Telehealth: Payer: Managed Care, Other (non HMO) | Admitting: Family Medicine

## 2022-12-31 DIAGNOSIS — U071 COVID-19: Secondary | ICD-10-CM

## 2022-12-31 DIAGNOSIS — R0602 Shortness of breath: Secondary | ICD-10-CM

## 2022-12-31 NOTE — Progress Notes (Signed)
Convert to VV 

## 2023-01-01 ENCOUNTER — Telehealth: Payer: Managed Care, Other (non HMO) | Admitting: Physician Assistant

## 2023-01-01 DIAGNOSIS — U071 COVID-19: Secondary | ICD-10-CM

## 2023-01-01 DIAGNOSIS — J4540 Moderate persistent asthma, uncomplicated: Secondary | ICD-10-CM | POA: Diagnosis not present

## 2023-01-01 MED ORDER — BREO ELLIPTA 100-25 MCG/ACT IN AEPB: 1.0000 | INHALATION_SPRAY | Freq: Every day | RESPIRATORY_TRACT | 0 refills | Status: AC

## 2023-01-01 MED ORDER — NIRMATRELVIR/RITONAVIR (PAXLOVID)TABLET: 3.0000 | ORAL_TABLET | Freq: Two times a day (BID) | ORAL | 0 refills | Status: AC

## 2023-01-01 NOTE — Patient Instructions (Signed)
 Zebedee Brain, thank you for joining Delon CHRISTELLA Dickinson, PA-C for today's virtual visit.  While this provider is not your primary care provider (PCP), if your PCP is located in our provider database this encounter information will be shared with them immediately following your visit.   A [facility] MyChart account gives you access to today's visit and all your visits, tests, and labs performed at [facility]  click here if you don't have a [facility] MyChart account or go to [email or web address]  Consent: (Patient) Kaitlyn Webb provided verbal consent for this virtual visit at the beginning of the encounter.  Current Medications:  Current Outpatient Medications:    nirmatrelvir /ritonavir  (PAXLOVID ) 20 x 150 MG & 10 x 100MG  TABS, Take 3 tablets by mouth 2 (two) times daily for 5 days. (Take nirmatrelvir  150 mg two tablets twice daily for 5 days and ritonavir  100 mg one tablet twice daily for 5 days) Patient GFR is 108, Disp: 30 tablet, Rfl: 0   albuterol  (VENTOLIN  HFA) 108 (90 Base) MCG/ACT inhaler, Inhale 1-2 puffs into the lungs every 6 (six) hours as needed for wheezing or shortness of breath., Disp: 1 Inhaler, Rfl: 6   BREO ELLIPTA  100-25 MCG/ACT AEPB, Inhale 1 puff into the lungs daily., Disp: 60 each, Rfl: 0   EPINEPHRINE  0.3 mg/0.3 mL IJ SOAJ injection, INJECT 0.3 MLS (0.3 MG TOTAL) INTO THE SKIN ONCE FOR 1 DOSE., Disp: 1 Device, Rfl: 12   ferrous sulfate  (FERROUSUL) 325 (65 FE) MG tablet, Take 1 tablet (325 mg total) by mouth 2 (two) times daily with a meal., Disp: 180 tablet, Rfl: 3   fluticasone  (FLONASE ) 50 MCG/ACT nasal spray, Place 1 spray into both nostrils 2 (two) times daily., Disp: , Rfl:    montelukast  (SINGULAIR ) 5 MG chewable tablet, Chew 5 mg by mouth at bedtime., Disp: , Rfl:    Multiple Vitamin (MULTIVITAMIN) tablet, Take 1 tablet by mouth daily., Disp: , Rfl:    naproxen  (NAPROSYN ) 500 MG tablet, TAKE 1 TABLET (500 MG TOTAL) BY MOUTH 2 (TWO) TIMES  DAILY WITH A MEAL. (Patient taking differently: Take 500 mg by mouth as needed.), Disp: 60 tablet, Rfl: 3   QVAR REDIHALER 40 MCG/ACT inhaler, Inhale into the lungs., Disp: , Rfl:    Vitamin D , Ergocalciferol , (DRISDOL ) 1.25 MG (50000 UNIT) CAPS capsule, Take 50,000 Units by mouth once a week., Disp: , Rfl:   Current Facility-Administered Medications:    medroxyPROGESTERone  (DEPO-PROVERA ) injection 150 mg, 150 mg, Intramuscular, Q90 days, Carlin Rollene CHRISTELLA, CNM   Medications ordered in this encounter:  Meds ordered this encounter  Medications   nirmatrelvir /ritonavir  (PAXLOVID ) 20 x 150 MG & 10 x 100MG  TABS    Sig: Take 3 tablets by mouth 2 (two) times daily for 5 days. (Take nirmatrelvir  150 mg two tablets twice daily for 5 days and ritonavir  100 mg one tablet twice daily for 5 days) Patient GFR is 108    Dispense:  30 tablet    Refill:  0    Supervising Provider:   BLAISE ALEENE KIDD [[id number]]   BREO ELLIPTA  100-25 MCG/ACT AEPB    Sig: Inhale 1 puff into the lungs daily.    Dispense:  60 each    Refill:  0    Supervising Provider:   BLAISE ALEENE KIDD [[id number]]     *If you need refills on other medications prior to your next appointment, please contact your pharmacy*  Follow-Up: Call back or seek an in-person evaluation if the symptoms worsen  or if the condition fails to improve as anticipated.  [facility] [phone]  Other Instructions Can take to lessen severity: Vit C 500mg  twice daily Quercertin 250-500mg  twice daily Zinc 75-100mg  daily Melatonin 3-6 mg at bedtime Vit D3 1000-2000 IU daily Optional: Famotidine 20mg  daily Also can add tylenol/ibuprofen as needed for fevers and body aches May add Mucinex or Mucinex DM as needed for cough/congestion    If you have been instructed to have an in-person evaluation today at a local Urgent Care facility, please use the link below. It will take you to a list of all of our available [facility],  including address, phone number and hours of operation. Please do not delay care.  [facility]  If you or a family member do not have a primary care provider, use the link below to schedule a visit and establish care. When you choose a [facility] primary care physician or advanced practice provider, you gain a long-term partner in health. Find a Primary Care Provider  Learn more about [facility] in-office and virtual care options: [facility] - Get Care Now

## 2023-01-01 NOTE — Progress Notes (Signed)
 Virtual Visit Consent   Kaitlyn Webb, you are scheduled for a virtual visit with a [facility] provider today. Just as with appointments in the office, your consent must be obtained to participate. Your consent will be active for this visit and any virtual visit you may have with one of our providers in the next 365 days. If you have a MyChart account, a copy of this consent can be sent to you electronically.  As this is a virtual visit, video technology does not allow for your provider to perform a traditional examination. This may limit your provider's ability to fully assess your condition. If your provider identifies any concerns that need to be evaluated in person or the need to arrange testing (such as labs, EKG, etc.), we will make arrangements to do so. Although advances in technology are sophisticated, we cannot ensure that it will always work on either your end or our end. If the connection with a video visit is poor, the visit may have to be switched to a telephone visit. With either a video or telephone visit, we are not always able to ensure that we have a secure connection.  By engaging in this virtual visit, you consent to the provision of healthcare and authorize for your insurance to be billed (if applicable) for the services provided during this visit. Depending on your insurance coverage, you may receive a charge related to this service.  I need to obtain your verbal consent now. Are you willing to proceed with your visit today? [name] has provided verbal consent on 01/01/2023 for a virtual visit (video or telephone). Delon CHRISTELLA Dickinson, PA-C  Date: 01/01/2023 12:40 PM  Virtual Visit via Video Note   I, Delon CHRISTELLA Dickinson, connected with  [name]  ([id number], [birth year]) on 01/01/23 at 12:30 PM EST by a video-enabled telemedicine application and verified that I am speaking with the correct person using two identifiers.  Location: Patient: Virtual Visit Location  Patient: Home Provider: Virtual Visit Location Provider: Home Office   I discussed the limitations of evaluation and management by telemedicine and the availability of in person appointments. The patient expressed understanding and agreed to proceed.    History of Present Illness: Kaitlyn Webb is a 22 y.o. who identifies as a female who was assigned female at birth, and is being seen today for Covid 35.  HPI: URI  This is a new problem. Episode onset: Symptoms started Friday, 12/28/22; Tested positive for Covid 19. The problem has been gradually worsening. There has been no fever. Associated symptoms include congestion, coughing, diarrhea (first couple of days), ear pain (right), headaches, nausea, a plugged ear sensation, a sore throat (scratchy) and wheezing (does have asthma). Pertinent negatives include no rhinorrhea, sinus pain or vomiting. Associated symptoms comments: Myalgias, brain fog. Treatments tried: tylenol cold and flu, Mucinex, Delsym. The treatment provided no relief.     Problems:  Patient Active Problem List   Diagnosis Date Noted   Atypical squamous cells of undetermined significance (ASCUS) on Papanicolaou smear of cervix 07/16/2022   Contraceptive management 02/09/2022   Depression, major, single episode, moderate (HCC) 01/31/2016   Vitamin D  deficiency 11/01/2015   Iron deficiency 10/11/2014   Asthma    ADHD (attention deficit hyperactivity disorder)    Allergy     Allergies:  Allergies  Allergen Reactions   Other Other (See Comments)    NUTS  CATS  DOGS  POLLEN  GRASSES  NUTS   CATS  DOGS  POLLEN  GRASSES   Medications:  Current Outpatient Medications:    nirmatrelvir /ritonavir  (PAXLOVID ) 20 x 150 MG & 10 x 100MG  TABS, Take 3 tablets by mouth 2 (two) times daily for 5 days. (Take nirmatrelvir  150 mg two tablets twice daily for 5 days and ritonavir  100 mg one tablet twice daily for 5 days) Patient GFR is 108, Disp: 30 tablet, Rfl: 0   albuterol   (VENTOLIN  HFA) 108 (90 Base) MCG/ACT inhaler, Inhale 1-2 puffs into the lungs every 6 (six) hours as needed for wheezing or shortness of breath., Disp: 1 Inhaler, Rfl: 6   BREO ELLIPTA  100-25 MCG/ACT AEPB, Inhale 1 puff into the lungs daily., Disp: 60 each, Rfl: 0   EPINEPHRINE  0.3 mg/0.3 mL IJ SOAJ injection, INJECT 0.3 MLS (0.3 MG TOTAL) INTO THE SKIN ONCE FOR 1 DOSE., Disp: 1 Device, Rfl: 12   ferrous sulfate  (FERROUSUL) 325 (65 FE) MG tablet, Take 1 tablet (325 mg total) by mouth 2 (two) times daily with a meal., Disp: 180 tablet, Rfl: 3   fluticasone  (FLONASE ) 50 MCG/ACT nasal spray, Place 1 spray into both nostrils 2 (two) times daily., Disp: , Rfl:    montelukast  (SINGULAIR ) 5 MG chewable tablet, Chew 5 mg by mouth at bedtime., Disp: , Rfl:    Multiple Vitamin (MULTIVITAMIN) tablet, Take 1 tablet by mouth daily., Disp: , Rfl:    naproxen  (NAPROSYN ) 500 MG tablet, TAKE 1 TABLET (500 MG TOTAL) BY MOUTH 2 (TWO) TIMES DAILY WITH A MEAL. (Patient taking differently: Take 500 mg by mouth as needed.), Disp: 60 tablet, Rfl: 3   QVAR REDIHALER 40 MCG/ACT inhaler, Inhale into the lungs., Disp: , Rfl:    Vitamin D , Ergocalciferol , (DRISDOL ) 1.25 MG (50000 UNIT) CAPS capsule, Take 50,000 Units by mouth once a week., Disp: , Rfl:   Current Facility-Administered Medications:    medroxyPROGESTERone  (DEPO-PROVERA ) injection 150 mg, 150 mg, Intramuscular, Q90 days, Carlin Rollene HERO, CNM  Observations/Objective: Patient is well-developed, well-nourished in no acute distress.  Resting comfortably at home.  Head is normocephalic, atraumatic.  No labored breathing.  Speech is clear and coherent with logical content.  Patient is alert and oriented at baseline.    Assessment and Plan: 1. COVID-19 (Primary) - nirmatrelvir /ritonavir  (PAXLOVID ) 20 x 150 MG & 10 x 100MG  TABS; Take 3 tablets by mouth 2 (two) times daily for 5 days. (Take nirmatrelvir  150 mg two tablets twice daily for 5 days and ritonavir  100  mg one tablet twice daily for 5 days) Patient GFR is 108  Dispense: 30 tablet; Refill: 0  2. Moderate persistent asthma without complication - BREO ELLIPTA  100-25 MCG/ACT AEPB; Inhale 1 puff into the lungs daily.  Dispense: 60 each; Refill: 0  - Continue OTC symptomatic management of choice - Will send OTC vitamins and supplement information through AVS - Paxlovid  prescribed - Breo refilled - Patient enrolled in MyChart symptom monitoring - Push fluids - Rest as needed - Discussed return precautions and when to seek in-person evaluation, sent via AVS as well   Follow Up Instructions: I discussed the assessment and treatment plan with the patient. The patient was provided an opportunity to ask questions and all were answered. The patient agreed with the plan and demonstrated an understanding of the instructions.  A copy of instructions were sent to the patient via MyChart unless otherwise noted below.    The patient was advised to call back or seek an in-person evaluation if the symptoms worsen or if the condition fails to improve as anticipated.  Delon CHRISTELLA Dickinson, PA-C

## 2023-01-24 ENCOUNTER — Encounter: Payer: Self-pay | Admitting: Obstetrics and Gynecology

## 2023-01-25 ENCOUNTER — Encounter: Payer: Self-pay | Admitting: Obstetrics and Gynecology

## 2023-01-25 DIAGNOSIS — Z30014 Encounter for initial prescription of intrauterine contraceptive device: Secondary | ICD-10-CM

## 2023-01-28 MED ORDER — MISOPROSTOL 100 MCG PO TABS: 100.0000 ug | ORAL_TABLET | Freq: Once | ORAL | 0 refills | Status: AC

## 2023-01-28 MED ORDER — ALPRAZOLAM 1 MG PO TABS: 1.0000 mg | ORAL_TABLET | Freq: Once | ORAL | 0 refills | Status: AC

## 2023-02-01 ENCOUNTER — Ambulatory Visit (INDEPENDENT_AMBULATORY_CARE_PROVIDER_SITE_OTHER): Payer: Managed Care, Other (non HMO)

## 2023-02-01 VITALS — BP 130/76 | HR 87 | Ht 61.0 in | Wt 142.6 lb

## 2023-02-01 DIAGNOSIS — Z3042 Encounter for surveillance of injectable contraceptive: Secondary | ICD-10-CM | POA: Diagnosis not present

## 2023-02-01 MED ORDER — MEDROXYPROGESTERONE ACETATE 150 MG/ML IM SUSY
150.0000 mg | PREFILLED_SYRINGE | Freq: Once | INTRAMUSCULAR | Status: AC
  Administered 2023-02-01: 150 mg via INTRAMUSCULAR

## 2023-02-01 NOTE — Progress Notes (Signed)
    NURSE VISIT NOTE  Subjective:    Patient ID: Valla Pacey, female    DOB: May 01, 2000, 23 y.o.   MRN: [id number]  HPI  Patient is a 23 y.o. G0P0000 female who presents for depo provera injection.   Objective:    BP 130/76   Pulse 87   Ht 5\' 1"  (1.549 m)   Wt 142 lb 9.6 oz (64.7 kg)   BMI 26.94 kg/m   Last Annual: 07/09/22. Last pap: 07/09/22. Last Depo-Provera: 11/02/22. Side Effects if any: n/a. Serum HCG indicated? No . Depo-Provera 150 mg IM given by: Georgiana Shore, CMA. Site: Right Deltoid    Assessment:   1. Encounter for Depo-Provera contraception      Plan:   Next appointment due between 04/19/23 and 05/03/23.    Loman Chroman, CMA

## 2023-02-14 ENCOUNTER — Ambulatory Visit: Payer: Managed Care, Other (non HMO)

## 2023-02-14 VITALS — BP 117/75 | HR 96 | Wt 141.0 lb

## 2023-02-14 DIAGNOSIS — Z30430 Encounter for insertion of intrauterine contraceptive device: Secondary | ICD-10-CM

## 2023-02-14 DIAGNOSIS — Z3202 Encounter for pregnancy test, result negative: Secondary | ICD-10-CM | POA: Diagnosis not present

## 2023-02-14 LAB — POCT URINE PREGNANCY: Preg Test, Ur: NEGATIVE

## 2023-02-14 NOTE — Progress Notes (Signed)
   ENCOUNTER FOR IUD INSERTION   Subjective  Jenise Iannelli is a 23 y.o. G0P0000 who presents today for IUD insertion. She desires reversible long-term contraception. We have thoroughly reviewed the risks, benefits, and alternatives, and she has elected to proceed with Mirena insertion.   She is premedicated with Xanax and oral misoprostol. She has a ride home with her mother.   Objective BP 117/75   Pulse 96   Wt 141 lb (64 kg)   BMI 26.64 kg/m   UPT: negative Pelvic exam: normal external genitalia, vulva, vagina, cervix, uterus and adnexa.   Procedure Note Consent was obtained prior to the procedure. A bimanual exam was performed to determine the position of the uterus. A sterile speculum was placed in the vagina, and the cervix was visualized. Betadine was applied to the cervix  followed by 4% lidocaine. An Lavena Bullion was placed on the anterior lip of the cervix, and gentle traction was applied to straighten and stabilize it. The uterus was sounded to about 6 cm. The IUD was inserted to the appropriate depth and the insertion tool was removed. The strings were trimmed to about 3 cm. Bleeding was minimal. Patient tolerated the procedure well. Post-procedure care and warning signs were reviewed with patient.  Follow up for annual visit or PRN.  Lindalou Hose Rickelle Sylvestre, CNM

## 2023-02-27 MED ORDER — LEVONORGESTREL 20 MCG/DAY IU IUD: 1.0000 | INTRAUTERINE_SYSTEM | Freq: Once | INTRAUTERINE | Status: AC

## 2023-02-27 NOTE — Addendum Note (Signed)
 Addended by: Autumn Messing on: 02/27/2023 09:22 AM   Modules accepted: Orders

## 2023-10-15 ENCOUNTER — Ambulatory Visit

## 2023-10-15 ENCOUNTER — Other Ambulatory Visit (HOSPITAL_COMMUNITY)
Admission: RE | Admit: 2023-10-15 | Discharge: 2023-10-15 | Disposition: A | Discharge status: Home / Self Care | Source: Ambulatory Visit | Attending: Certified Nurse Midwife | Admitting: Certified Nurse Midwife

## 2023-10-15 VITALS — BP 109/74 | HR 84 | Ht 61.0 in | Wt 143.0 lb

## 2023-10-15 DIAGNOSIS — Z113 Encounter for screening for infections with a predominantly sexual mode of transmission: Secondary | ICD-10-CM | POA: Diagnosis present

## 2023-10-15 NOTE — Progress Notes (Signed)
    NURSE VISIT NOTE  Subjective:    Patient ID: Kaitlyn Webb, female    DOB: 12/03/2000, 23 y.o.   MRN: [id number]  HPI  Patient is a 23 y.o. G0P0000 female who presents for STI screening, has not been sexually active in 2 years but will be soon.   Objective:    BP 109/74   Pulse 84   Ht 5' 1 (1.549 m)   Wt 143 lb (64.9 kg)   BMI 27.02 kg/m    No results found for any visits on 10/15/23.  Assessment:   1. Routine screening for STI (sexually transmitted infection)       Plan:   GC and chlamydia DNA  probe sent to lab.     Harlene Gander, CMA

## 2023-10-16 LAB — CERVICOVAGINAL ANCILLARY ONLY
Bacterial Vaginitis (gardnerella): POSITIVE — AB
Candida Glabrata: NEGATIVE
Candida Vaginitis: NEGATIVE
Chlamydia: NEGATIVE
Comment: NEGATIVE
Comment: NEGATIVE
Comment: NEGATIVE
Comment: NEGATIVE
Comment: NEGATIVE
Comment: NORMAL
Neisseria Gonorrhea: NEGATIVE
Trichomonas: NEGATIVE

## 2023-10-17 ENCOUNTER — Ambulatory Visit: Payer: Self-pay

## 2023-10-17 ENCOUNTER — Other Ambulatory Visit: Payer: Self-pay

## 2023-10-17 DIAGNOSIS — B9689 Other specified bacterial agents as the cause of diseases classified elsewhere: Secondary | ICD-10-CM

## 2023-10-17 MED ORDER — METRONIDAZOLE 500 MG PO TABS: 500.0000 mg | ORAL_TABLET | Freq: Two times a day (BID) | ORAL | 0 refills | Status: AC

## 2023-10-22 ENCOUNTER — Other Ambulatory Visit (HOSPITAL_COMMUNITY)
Admission: RE | Admit: 2023-10-22 | Discharge: 2023-10-22 | Disposition: A | Source: Ambulatory Visit | Attending: Obstetrics & Gynecology | Admitting: Obstetrics & Gynecology

## 2023-10-22 ENCOUNTER — Telehealth: Payer: Self-pay

## 2023-10-22 ENCOUNTER — Encounter: Payer: Self-pay | Admitting: Obstetrics & Gynecology

## 2023-10-22 ENCOUNTER — Ambulatory Visit (INDEPENDENT_AMBULATORY_CARE_PROVIDER_SITE_OTHER): Admitting: Obstetrics & Gynecology

## 2023-10-22 VITALS — BP 114/70 | HR 81 | Ht 61.0 in | Wt 145.0 lb

## 2023-10-22 DIAGNOSIS — R5383 Other fatigue: Secondary | ICD-10-CM

## 2023-10-22 DIAGNOSIS — E559 Vitamin D deficiency, unspecified: Secondary | ICD-10-CM

## 2023-10-22 DIAGNOSIS — Z124 Encounter for screening for malignant neoplasm of cervix: Secondary | ICD-10-CM

## 2023-10-22 DIAGNOSIS — Z23 Encounter for immunization: Secondary | ICD-10-CM

## 2023-10-22 DIAGNOSIS — Z113 Encounter for screening for infections with a predominantly sexual mode of transmission: Secondary | ICD-10-CM

## 2023-10-22 DIAGNOSIS — Z01419 Encounter for gynecological examination (general) (routine) without abnormal findings: Secondary | ICD-10-CM | POA: Diagnosis not present

## 2023-10-22 DIAGNOSIS — Z800 Family history of malignant neoplasm of digestive organs: Secondary | ICD-10-CM | POA: Diagnosis not present

## 2023-10-22 DIAGNOSIS — Z975 Presence of (intrauterine) contraceptive device: Secondary | ICD-10-CM

## 2023-10-22 MED ORDER — NORETHINDRONE ACETATE 5 MG PO TABS: ORAL_TABLET | ORAL | 6 refills | Status: AC

## 2023-10-22 NOTE — Progress Notes (Signed)
 GYNECOLOGY ANNUAL PHYSICAL EXAM PROGRESS NOTE  Subjective:    Kaitlyn Webb is a 22 y.o. single G0 who presents for an annual exam.  The patient is not currently sexually active. The patient participates in regular exercise: yes. (Cardio and strength) Has the patient ever been transfused or tattooed?: no. The patient reports that there is not domestic violence in her life.   The patient has the following complaints today: She has no gyn concerns. She has Mirena  and is happy with this. She has some irregular bleeding.   Menstrual History: Menarche age: 50 No LMP recorded. (Menstrual status: IUD).     Gynecologic History:  Contraception: IUD History of STI's:  Last Pap: 2024. Results were: ASCUS HR HPV negative.  Denies/Notes She thinks she got Gardasil in the past.   OB History  Gravida Para Term Preterm AB Living  0 0 0 0 0 0  SAB IAB Ectopic Multiple Live Births  0 0 0 0 0    Past Medical History:  Diagnosis Date   ADHD (attention deficit hyperactivity disorder)    Allergy    Asthma    Depression    Iron deficiency anemia     Past Surgical History:  Procedure Laterality Date   NO PAST SURGERIES      Family History  Problem Relation Age of Onset   Hypertension Mother    Hypertension Father    Hypertension Maternal Grandmother    Arthritis Maternal Grandmother        Rheumatoid   Diabetes Maternal Grandmother    Lupus Maternal Grandmother    Cancer Maternal Grandmother        not sure cancer type   Diabetes Maternal Grandfather    Hypertension Maternal Grandfather     Social History   Socioeconomic History   Marital status: Single    Spouse name: Not on file   Number of children: Not on file   Years of education: Not on file   Highest education level: Not on file  Occupational History   Not on file  Tobacco Use   Smoking status: Never   Smokeless tobacco: Never  Vaping Use   Vaping status: Never Used  Substance and Sexual Activity    Alcohol use: No   Drug use: No   Sexual activity: Not Currently    Birth control/protection: Injection  Other Topics Concern   Not on file  Social History Narrative   Not on file   Social Drivers of Health   Financial Resource Strain: Low Risk  (11/07/2022)   Received from [facility]   Overall Financial Resource Strain (CARDIA)    Difficulty of Paying Living Expenses: Not very hard  Food Insecurity: No Food Insecurity (11/07/2022)   [facility]   Hunger Vital Sign    Within the past 12 months, you worried that your food would run out before you got the money to buy more.: Never true    Within the past 12 months, the food you bought just didn't last and you didn't have money to get more.: Never true  Transportation Needs: No Transportation Needs (11/07/2022)   Received from [facility] - [facility]    Lack of Transportation (Medical): No    Lack of Transportation (Non-Medical): No  Physical Activity: Insufficiently Active (01/31/2017)   Exercise Vital Sign    Days of Exercise per Week: 2 days    Minutes of Exercise per Session: 60 min  Stress: No Stress  Concern Present (01/31/2017)   [employer] [facility] [employer] - Occupational Stress Questionnaire    Feeling of Stress : Not at all  Social Connections: Unknown (06/22/2022)   Received from [facility]   Social Network    Social Network: Not on file  Intimate Partner Violence: Unknown (06/22/2022)   Received from [facility]   HITS    Physically Hurt: Not on file    Insult or Talk Down To: Not on file    Threaten Physical Harm: Not on file    Scream or Curse: Not on file    Current Outpatient Medications on File Prior to Visit  Medication Sig Dispense Refill   Multiple Vitamin (MULTIVITAMIN) tablet Take 1 tablet by mouth daily.     albuterol  (VENTOLIN  HFA) 108 (90 Base) MCG/ACT inhaler Inhale 1-2 puffs into the lungs every 6 (six) hours as needed for wheezing or shortness of  breath. 1 Inhaler 6   BREO ELLIPTA  100-25 MCG/ACT AEPB Inhale 1 puff into the lungs daily. 60 each 0   EPINEPHRINE  0.3 mg/0.3 mL IJ SOAJ injection INJECT 0.3 MLS (0.3 MG TOTAL) INTO THE SKIN ONCE FOR 1 DOSE. 1 Device 12   ferrous sulfate  (FERROUSUL) 325 (65 FE) MG tablet Take 1 tablet (325 mg total) by mouth 2 (two) times daily with a meal. 180 tablet 3   fluticasone  (FLONASE ) 50 MCG/ACT nasal spray Place 1 spray into both nostrils 2 (two) times daily.     metroNIDAZOLE  (FLAGYL ) 500 MG tablet Take 1 tablet (500 mg total) by mouth 2 (two) times daily. (Patient not taking: Reported on 10/22/2023) 14 tablet 0   misoprostol  (CYTOTEC ) 100 MCG tablet Take 1 tablet (100 mcg total) by mouth once for 1 dose. 1 hour before appt 1 tablet 0   montelukast  (SINGULAIR ) 5 MG chewable tablet Chew 5 mg by mouth at bedtime.     naproxen  (NAPROSYN ) 500 MG tablet TAKE 1 TABLET (500 MG TOTAL) BY MOUTH 2 (TWO) TIMES DAILY WITH A MEAL. (Patient taking differently: Take 500 mg by mouth as needed.) 60 tablet 3   QVAR REDIHALER 40 MCG/ACT inhaler Inhale into the lungs.     Vitamin D , Ergocalciferol , (DRISDOL ) 1.25 MG (50000 UNIT) CAPS capsule Take 50,000 Units by mouth once a week.     Current Facility-Administered Medications on File Prior to Visit  Medication Dose Route Frequency Provider Last Rate Last Admin   levonorgestrel  (MIRENA ) 20 MCG/DAY IUD 1 each  1 each Intrauterine Once         Allergies  Allergen Reactions   Other Other (See Comments)    NUTS  CATS  DOGS  POLLEN  GRASSES  NUTS   CATS  DOGS  POLLEN  GRASSES     Review of Systems Constitutional: negative for chills, fatigue, fevers and sweats Eyes: negative for irritation, redness and visual disturbance Ears, nose, mouth, throat, and face: negative for hearing loss, nasal congestion, snoring and tinnitus Respiratory: negative for asthma, cough, sputum Cardiovascular: negative for chest pain, dyspnea, exertional chest pressure/discomfort,  irregular heart beat, palpitations and syncope Gastrointestinal: negative for abdominal pain, change in bowel habits, nausea and vomiting Genitourinary: negative for abnormal menstrual periods, genital lesions, sexual problems and vaginal discharge, dysuria and urinary incontinence Integument/breast: negative for breast lump, breast tenderness and nipple discharge Hematologic/lymphatic: negative for bleeding and easy bruising Musculoskeletal:negative for back pain and muscle weakness Neurological: negative for dizziness, headaches, vertigo and weakness Endocrine: negative for diabetic symptoms including polydipsia, polyuria and skin dryness Allergic/Immunologic:  negative for hay fever and urticaria      Objective:  Blood pressure 114/70, pulse 81, height 5' 1 (1.549 m), weight 145 lb (65.8 kg). Body mass index is 27.4 kg/m.    General Appearance:    Alert, cooperative, no distress, appears stated age  Head:    Normocephalic, without obvious abnormality, atraumatic  Eyes:    PERRL, conjunctiva/corneas clear, EOM's intact, both eyes  Ears:    Normal external ear canals, both ears  Nose:   Nares normal, septum midline, mucosa normal, no drainage or sinus tenderness  Throat:   Lips, mucosa, and tongue normal; teeth and gums normal  Neck:   Supple, symmetrical, trachea midline, no adenopathy; thyroid: no enlargement/tenderness/nodules; no carotid bruit or JVD  Back:     Symmetric, no curvature, ROM normal, no CVA tenderness  Lungs:     Clear to auscultation bilaterally, respirations unlabored  Chest Wall:    No tenderness or deformity   Heart:    Regular rate and rhythm, S1 and S2 normal, no murmur, rub or gallop  Breast Exam:    No tenderness, masses, or nipple abnormality  Abdomen:     Soft, non-tender, bowel sounds active all four quadrants, no masses, no organomegaly.    Genitalia:    Pelvic:external genitalia normal, vagina without lesions, discharge, or tenderness,  Cervix normal in  appearance, no cervical motion tenderness, no adnexal masses or tenderness.  Uterus normal size, shape, mobile, regular contours, nontender.     Extremities:   Extremities normal, atraumatic, no cyanosis or edema  Pulses:   2+ and symmetric all extremities  Skin:   Skin color, texture, turgor normal, no rashes or lesions  Lymph nodes:   Cervical, supraclavicular, and axillary nodes normal  Neurologic:   CNII-XII intact, normal strength, sensation and reflexes throughout   .  Labs:  Lab Results  Component Value Date   WBC 4.8 10/06/2020   HGB 13.1 10/06/2020   HCT 41.9 10/06/2020   MCV 79 10/06/2020   PLT 333 10/06/2020    Lab Results  Component Value Date   CREATININE 0.77 10/06/2020   BUN 9 10/06/2020   NA 139 10/06/2020   K 4.0 10/06/2020   CL 104 10/06/2020   CO2 21 10/06/2020    Lab Results  Component Value Date   ALT 9 10/06/2020   AST 16 10/06/2020   ALKPHOS 70 10/06/2020   BILITOT 0.8 10/06/2020    Lab Results  Component Value Date   TSH 1.190 10/06/2020     Assessment:   Well woman exam H/o ASCUS FH of Lynch syndrome in her maternal aunt Fatigue Routine STI screening   Plan:   Breast self exam technique reviewed and patient encouraged to perform self-exam monthly. Genetic testing today Discussed healthy lifestyle modifications. Pap smear ordered. Flu vaccine: will get it today Follow up in 1 year for annual exam   Starla Harland BROCKS, MD [facility]

## 2023-10-22 NOTE — Telephone Encounter (Signed)
 Patient was seen today and had [facility] test done due to family history of lynch syndrome. Question which to order? Per [facility] call patient and ask which cancer specifically for aunt? 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

## 2023-10-23 LAB — HEPATITIS C ANTIBODY: Hep C Virus Ab: NONREACTIVE

## 2023-10-23 LAB — RPR: RPR Ser Ql: NONREACTIVE

## 2023-10-23 LAB — HEPATITIS B SURFACE ANTIGEN: Hepatitis B Surface Ag: NEGATIVE

## 2023-10-23 LAB — VITAMIN D 25 HYDROXY (VIT D DEFICIENCY, FRACTURES): Vit D, 25-Hydroxy: 34.2 ng/mL (ref 30.0–100.0)

## 2023-10-23 LAB — HIV ANTIBODY (ROUTINE TESTING W REFLEX): HIV Screen 4th Generation wRfx: NONREACTIVE

## 2023-10-23 LAB — TSH+FREE T4
Free T4: 1.14 ng/dL (ref 0.82–1.77)
TSH: 1.51 u[IU]/mL (ref 0.450–4.500)

## 2023-10-28 LAB — CYTOLOGY - PAP
Chlamydia: NEGATIVE
Comment: NEGATIVE
Comment: NORMAL
Diagnosis: NEGATIVE
Neisseria Gonorrhea: NEGATIVE

## 2023-10-29 ENCOUNTER — Telehealth: Payer: Self-pay

## 2023-10-29 NOTE — Telephone Encounter (Signed)
 Patient calling in with complaints related to the Aygestin  medication she started taking 3 days ago. Reports she started her period and 30 minutes in she began to feel nauseas, moody along with back/leg and breast pain. Reports she has taken a total of 3 doses and stopped due to the symptoms, however her bleeding did stop. Please advise on whether patient should continue medication if bleeding resumes or what her next step should be.

## 2023-10-30 ENCOUNTER — Encounter: Payer: Self-pay | Admitting: Obstetrics & Gynecology

## 2023-11-02 DIAGNOSIS — Z1371 Encounter for nonprocreative screening for genetic disease carrier status: Secondary | ICD-10-CM

## 2023-11-02 HISTORY — DX: Encounter for nonprocreative screening for genetic disease carrier status: Z13.71

## 2023-11-19 ENCOUNTER — Encounter: Payer: Self-pay | Admitting: Obstetrics and Gynecology

## 2023-12-17 ENCOUNTER — Encounter: Payer: Self-pay | Admitting: Obstetrics & Gynecology
